# Patient Record
Sex: Male | Born: 1990 | Race: White | Hispanic: No | Marital: Single | Smoking: Current every day smoker
Health system: Southern US, Community
[De-identification: ages and names within clinical notes are randomized; demographics above are authoritative.]

## PROBLEM LIST (undated history)

## (undated) DIAGNOSIS — R569 Unspecified convulsions: Secondary | ICD-10-CM

## (undated) HISTORY — PX: APPENDECTOMY: SHX54

---
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
## 2010-12-02 LAB — POCT URINALYSIS DIP (DEVICE)
Glucose, UA: NEGATIVE mg/dL
Specific Gravity, Urine: 1.02 (ref 1.005–1.030)
Urobilinogen, UA: 0.2 mg/dL (ref 0.0–1.0)

## 2011-06-14 ENCOUNTER — Observation Stay (HOSPITAL_COMMUNITY)
Admission: EM | Admit: 2011-06-14 | Discharge: 2011-06-15 | DRG: 883 | Disposition: A | Discharge status: Home / Self Care | Payer: BC Managed Care – HMO | Attending: General Surgery | Admitting: General Surgery

## 2011-06-14 ENCOUNTER — Emergency Department (HOSPITAL_COMMUNITY): Payer: BC Managed Care – HMO

## 2011-06-14 DIAGNOSIS — R1031 Right lower quadrant pain: Secondary | ICD-10-CM

## 2011-06-14 DIAGNOSIS — R112 Nausea with vomiting, unspecified: Secondary | ICD-10-CM

## 2011-06-14 DIAGNOSIS — K3580 Unspecified acute appendicitis: Principal | ICD-10-CM | POA: Insufficient documentation

## 2011-06-14 LAB — DIFFERENTIAL
Basophils Absolute: 0 10*3/uL (ref 0.0–0.1)
Basophils Relative: 0 % (ref 0–1)
Monocytes Relative: 4 % (ref 3–12)
Neutro Abs: 15.9 10*3/uL — ABNORMAL HIGH (ref 1.7–7.7)
Neutrophils Relative %: 87 % — ABNORMAL HIGH (ref 43–77)

## 2011-06-14 LAB — COMPREHENSIVE METABOLIC PANEL
ALT: 9 U/L (ref 0–53)
AST: 15 U/L (ref 0–37)
Alkaline Phosphatase: 111 U/L (ref 39–117)
CO2: 27 mEq/L (ref 19–32)
Chloride: 100 mEq/L (ref 96–112)
GFR calc Af Amer: >90 mL/min (ref >90)
GFR calc non Af Amer: >90 mL/min (ref >90)
Glucose, Bld: 101 mg/dL — ABNORMAL HIGH (ref 70–99)
Potassium: 3.8 mEq/L (ref 3.5–5.1)
Sodium: 138 mEq/L (ref 135–145)

## 2011-06-14 LAB — CBC
Hemoglobin: 17 g/dL (ref 13.0–17.0)
MCH: 32.7 pg (ref 26.0–34.0)
Platelets: 274 10*3/uL (ref 150–400)
RBC: 5.2 MIL/uL (ref 4.22–5.81)
WBC: 18.3 10*3/uL — ABNORMAL HIGH (ref 4.0–10.5)

## 2011-06-14 MED ORDER — IOHEXOL 300 MG/ML  SOLN
75.0000 mL | Freq: Once | INTRAMUSCULAR | Status: AC | PRN
  Administered 2011-06-14: 75 mL via INTRAVENOUS

## 2011-06-15 ENCOUNTER — Other Ambulatory Visit (INDEPENDENT_AMBULATORY_CARE_PROVIDER_SITE_OTHER): Payer: Self-pay | Admitting: Surgery

## 2011-06-15 DIAGNOSIS — K3580 Unspecified acute appendicitis: Secondary | ICD-10-CM

## 2011-06-19 NOTE — Discharge Summary (Signed)
  NAME:  [name], [name] NO.:  [id number]  MEDICAL RECORD NO.:  [id number]  LOCATION:  1529                         FACILITY:  [facility]  PHYSICIAN:  [name], MDDATE OF BIRTH:  1990/09/09  DATE OF ADMISSION:  06/14/2011 DATE OF DISCHARGE:  06/15/2011                              DISCHARGE SUMMARY   ADMISSION DIAGNOSIS:  Acute appendicitis.  DISCHARGE DIAGNOSIS:  Acute appendicitis.  PROCEDURE:  Laparoscopic appendectomy on June 15, 2011.  BRIEF HISTORY:  The patient is a healthy 20 year old white male, who identified no primary medical doctor.  He developed abdominal pain around 4 a.m. on the day of admission, which got progressively worse. He was seen by Urgent Care and had been to that Urgent Care several times, but not for abdominal pain.  They sent him to the ER at [facility], where a CT scan was obtained and was suggestive of acute appendicitis.  He was seen by Dr. Ovidio Kin and was subsequently admitted for appendectomy.  He does have some history of irritable bowel syndrome.  He has no prior abdominal surgery.  For further history and physical, please see the dictated note.  HOSPITAL COURSE:  Patient was admitted and taken to the operating room in the early a.m. of June 15, 2011.  He tolerated the procedure well. Following a.m., he was having a fair amount of pain and gave him some Toradol.  He is also having some constipation.  He has been treated with MiraLax.  Initially, I did not feel like he would be ready for discharge, but at 4 o'clock, he was feeling much better and is ready for discharge.  He was able to eat lunch and breakfast without any problems. His incisions look good.  He has postoperative laparoscopic surgery, instructions for follow-up in our office, and instructions to call for fever, difficulty of voiding, bleeding from his incision, increased pain, redness, or drainage.  DISCHARGE MEDICATIONS:  He can use either  Tylenol or ibuprofen p.r.n. for mild pain and Vicodin 5/325 one to two p.o. q.4 h. p.r.n. for pain, if not relieved by the first two.  He is to call our office for followup with Dr. Ezzard Standing in 2 to 3 weeks.  CONDITION ON DISCHARGE:  Improved.    [name], P.A.   ______________________________ Casey Gosling, MD   WDJ/MEDQ  D:  06/15/2011  T:  06/15/2011  Job:  [id number]  Electronically Signed by Sherrie George P.A. on 06/17/2011 08:56:31 PM Electronically Signed by Emelia Loron MD on 06/19/2011 11:35:38 AM

## 2011-06-19 NOTE — Consult Note (Signed)
NAMECYRIL, Abbott           ACCOUNT NO.:  [id number]  MEDICAL RECORD NO.:  [id number]  LOCATION:  1529                         FACILITY:  [facility]  PHYSICIAN:  Sandria Bales. Ezzard Standing, M.D.  DATE OF BIRTH:  1991/08/02  DATE OF CONSULTATION: 14 Jun 2011                                CONSULTATION   REFERRING PHYSICIAN:  Lorre Nick, M.D.  REASON FOR CONSULTATION:  Appendicitis.  HISTORY OF PRESENT ILLNESS:  This is a 20 year old white male, who has no identified primary medical doctor, woke up about 4 a.m. this morning with some abdominal pain, which got worse.  By noon, he got bad enough that he went to the Urgent Care on [address].  Apparently, he has been to that Urgent Care several times by the way he describes, but not for this type of abdominal pain.  They, then sent him to the [facility] Emergency Room, where a CT scan was done.  The CT scan read by Dr. Kennith Center suggested a suspicious for early appendicitis.  The patient has had no prior history of peptic ulcer disease, liver disease, pancreatic disease, or colon disease, but he does have some irritable bowel.  He has had no prior abdominal surgery.  He has no allergies.  He is on no medications.  REVIEW OF SYSTEMS:  NEUROLOGIC:  No seizures or loss of consciousness. CARDIAC:  No history of heart disease or chest pain.   PULMONARY:  No history of pneumonia, though he does smoke a hookah.   GASTROINTESTINAL: See history of present illness.  UROLOGIC:  No history of kidney stones or kidney infections.   MUSCULOSKELETAL:  No joint or back trouble.  PERSONAL HISTORY:  He is accompanied by his mother, [name].  She had her appendix removed when she had her gallbladder taken out, so there is no hsitory of appendicitis in the family.  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  He otherwise is  staying in his mother's house.  PHYSICAL EXAMINATION:  VITAL SIGNS:  His temperature is 98, blood pressure 130/82, pulse 71, respirations 20. GENERAL:  He is a well-nourished white male, who is somewhat thin.  He is also little bit rambling in his conversation. HEENT:  Unremarkable. NECK:  Supple without mass, without thyromegaly. LUNGS:  Clear to auscultation with symmetric breath sounds. HEART:  Has a regular rhythm.  I hear no murmur or rub.   ABDOMEN:  Shows bowel sounds, which are present, but decreased.  He is tender in his right lower quadrant without really guarding.  His abdomen is scaphoid. He has no abdominal scars. EXTREMITIES:  He has good strength in upper and lower extremities. NEUROLOGIC:  Grossly intact.  LABORATORY DATA:  Labs I have show a hemoglobin of 17, hematocrit 47, white blood count of 18,300.  His platelet count 274,000.  His sodium is 138, potassium 3.8, chloride of 100, CO2 of 27, glucose of 101, BUN of 13, creatinine of 0.87.  He has been told he has an elevated bilirubin, actually bilirubin today is normal.  Review of CT scan, which shows what appears to be an appendolith and a somewhat enlarged appendix without stranding.  ASSESSMENT AND PLAN/IMPRESSION:  Probably early appendicitis.  I discussed with the patient and his mother about proceeding with an appendectomy tonight.  I told them we try to do this laparoscopically. The risks of surgery include bleeding, infection, some other diagnosis than appendicitis.    His mother is, [name], her phone number is [phone].   Sandria Bales. Ezzard Standing, M.D., FACS   DHN/MEDQ  D:  06/14/2011  T:  06/15/2011  Job:  [id number]  Electronically Signed by Ovidio Kin M.D. on 06/19/2011 11:48:57 AM

## 2011-06-19 NOTE — Op Note (Signed)
NAMEKRIS, [name]           ACCOUNT [name].:  [id number]  MEDICAL RECORD [name].:  [id number]  LOCATION:  1529                         FACILITY:  [facility]  PHYSICIAN:  Sandria Bales. Ezzard Standing, M.D.  DATE OF BIRTH:  07-09-1991  DATE OF PROCEDURE:  06/15/2011                              OPERATIVE REPORT   PREOPERATIVE DIAGNOSIS:  Acute appendicitis.  POSTOPERATIVE DIAGNOSIS:  Acute suppurative appendicitis with infarcted tip.  PROCEDURES:  Laparoscopic appendectomy.  SURGEON:  Sandria Bales. Ezzard Standing, M.D.  FIRST ASSISTANT:  None.  ANESTHESIA:  General endotracheal, supervised by Dr. Lucille Passy, with 15 cc of 0.25% Marcaine.  COMPLICATIONS:  None.  INDICATION FOR PROCEDURE:  Mr. [name] is a 20 year old white male, who has [name] primary care doctor, but was seen by Maryjean Morn, PA at [facility] on [address] for abdominal pain.  He was sent to the [facility] Emergency room where CT scan revealed probable appendicitis.  I discussed the indications, potential complications of the surgery with the patient and his mother.  The potential complications include, but are not limited to, bleeding, bowel injury, open surgery, or another diagnosis than appendicitis.  OPERATIVE NOTE:  Patient was given a general endotracheal anesthesia, supervised Dr. Lucille Passy, in room #1.  His right arm was out, his left arm was tucked to his side.  I did not put a Foley in.  He was on cefoxitin antibiotic and re-dosed immediately preop.  A time-out was held and surgical checklist run.  I went through an infraumbilical incision with sharp dissection carried down to the abdominal cavity.  A 0 degree 10 mm laparoscope was inserted through a 12 mm Hasson trocar.  The Hasson trocar was secured with a 0 Vicryl suture.  I used both 5 and a 10 mm scope.  I placed a 5 mm trocar in the right upper quadrant, a 5 mm trocar in left lower quadrant.  The right and left lobes of liver unremarkable.   Stomach was unremarkable.  The bowel that I could see was unremarkable.  On the right lower quadrant, I found the appendix.  The tip of the appendix was maybe 2 cm, looks like infarcted, it was purulent and was encased in omentum.  I teased the omentum away through the mesentery of the appendix down with a Harmonic Scalpel.  I used a 45 mm vascular load of the Ethicon Endo-GIA stapler to divide the appendix.  Appendix was placed in EndoCatch bag and delivered through the umbilicus and sent to pathology.  I irrigated the abdomen with about 500 cc of saline.  I reinspected the appendiceal stump and the cecum which looked good, but he had some adhesions in his right colon along the right lateral abdominal wall, but I left these alone. 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  I then removed the Hasson trocar and closed with a 0 Vicryl suture.  I pulled up the 5 mm trocar and closed the skin on each site with a 5-0 Monocryl suture, painted the wounds with Dermabond.    The patient tolerated procedure well, was transferred to recovery in good condition.  Sponge and needle counts were correct in the case.   Sandria Bales.  Ezzard Standing, M.D., FACS    DHN/MEDQ  D:  06/15/2011  T:  06/15/2011  Job:  [id number]  cc:   Maryjean Morn, PA [facility] [place], [place]  Electronically Signed by Ovidio Kin M.D. on 06/19/2011 11:50:00 AM

## 2013-04-19 ENCOUNTER — Emergency Department (HOSPITAL_COMMUNITY)
Admission: EM | Admit: 2013-04-19 | Discharge: 2013-04-19 | Disposition: A | Discharge status: Home / Self Care | Payer: No Typology Code available for payment source | Attending: Emergency Medicine | Admitting: Emergency Medicine

## 2013-04-19 ENCOUNTER — Emergency Department (HOSPITAL_COMMUNITY): Payer: No Typology Code available for payment source

## 2013-04-19 ENCOUNTER — Encounter (HOSPITAL_COMMUNITY): Payer: Self-pay | Admitting: *Deleted

## 2013-04-19 DIAGNOSIS — S139XXA Sprain of joints and ligaments of unspecified parts of neck, initial encounter: Secondary | ICD-10-CM | POA: Insufficient documentation

## 2013-04-19 DIAGNOSIS — Z8669 Personal history of other diseases of the nervous system and sense organs: Secondary | ICD-10-CM | POA: Insufficient documentation

## 2013-04-19 DIAGNOSIS — S298XXA Other specified injuries of thorax, initial encounter: Secondary | ICD-10-CM | POA: Insufficient documentation

## 2013-04-19 DIAGNOSIS — Y92410 Unspecified street and highway as the place of occurrence of the external cause: Secondary | ICD-10-CM | POA: Insufficient documentation

## 2013-04-19 DIAGNOSIS — S161XXA Strain of muscle, fascia and tendon at neck level, initial encounter: Secondary | ICD-10-CM

## 2013-04-19 DIAGNOSIS — R252 Cramp and spasm: Secondary | ICD-10-CM

## 2013-04-19 DIAGNOSIS — F17200 Nicotine dependence, unspecified, uncomplicated: Secondary | ICD-10-CM | POA: Insufficient documentation

## 2013-04-19 DIAGNOSIS — Y9389 Activity, other specified: Secondary | ICD-10-CM | POA: Insufficient documentation

## 2013-04-19 DIAGNOSIS — R209 Unspecified disturbances of skin sensation: Secondary | ICD-10-CM | POA: Insufficient documentation

## 2013-04-19 HISTORY — DX: Unspecified convulsions: R56.9

## 2013-04-19 MED ORDER — IBUPROFEN 800 MG PO TABS: 800.0000 mg | ORAL_TABLET | Freq: Three times a day (TID) | ORAL | Status: AC

## 2013-04-19 NOTE — ED Provider Notes (Signed)
Medical screening examination/treatment/procedure(s) were performed by non-physician practitioner and as supervising physician I was immediately available for consultation/collaboration.   Junius Argyle, MD 04/19/13 1556
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
## 2013-04-19 NOTE — Progress Notes (Signed)
[facility] did not get to see patient but will be sending him information about the [facility], using the address provided.  °
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
## 2013-05-11 IMAGING — CT CT ABD-PELV W/ CM
2 of 4 series · 16 of 46 positions shown, 18 images · IV contrast (APPLIED)
Comparison: None.

CLINICAL DATA: Right lower quadrant pain.  Nausea.

CT ABDOMEN AND PELVIS WITH CONTRAST
TECHNIQUE: Multidetector CT imaging of the abdomen and pelvis was
performed following the standard protocol during bolus
administration of intravenous contrast.
Contrast: 75mL OMNIPAQUE IOHEXOL 300 MG/ML IV SOLN

[Series 2: abd/pel with · axial · 0.74mm/px · z∈[+763,+1178]mm · 13 of 91 slices shown, 15 images]
[im 4/91  soft-tissue]
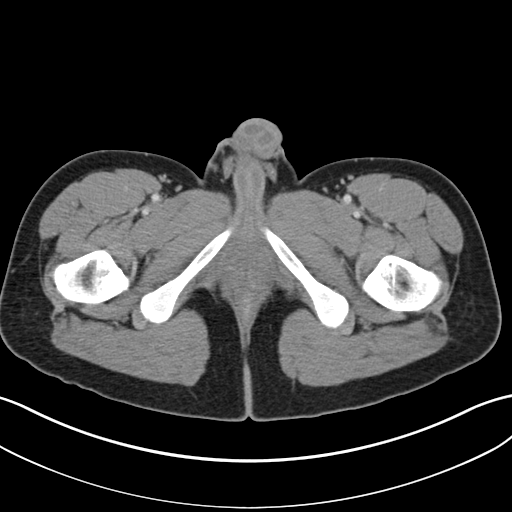
[im 4/91  bone]
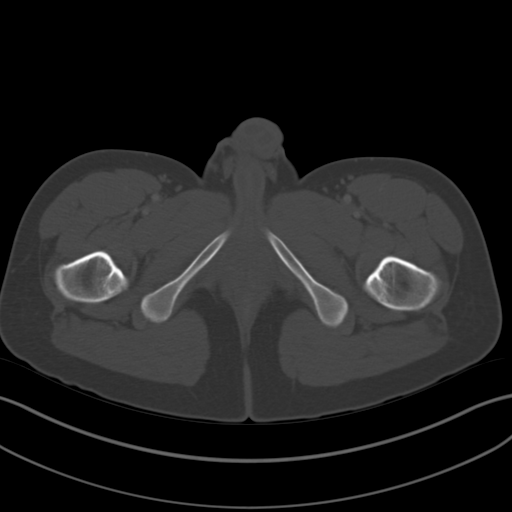
[im 11/91  soft-tissue]
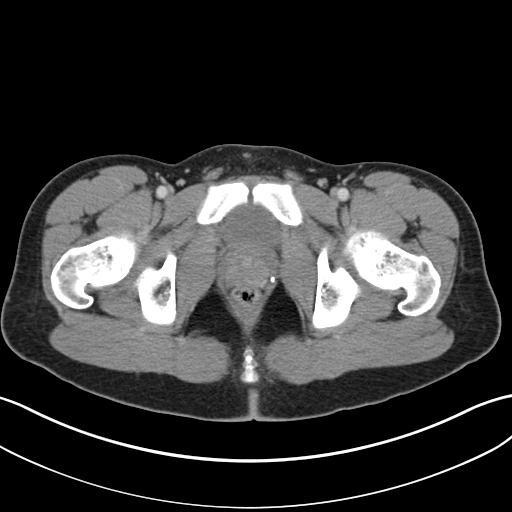
[im 19/91  soft-tissue]
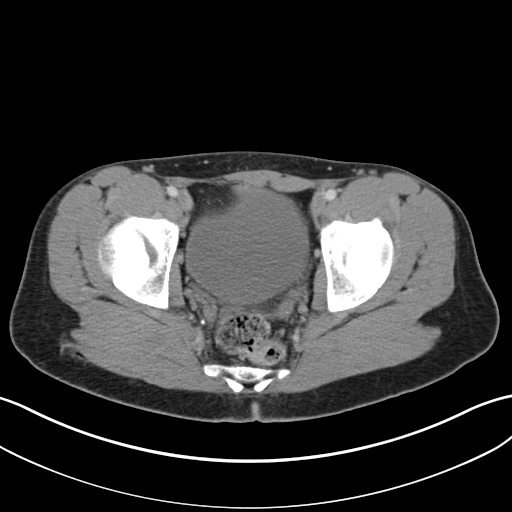
[im 26/91  soft-tissue]
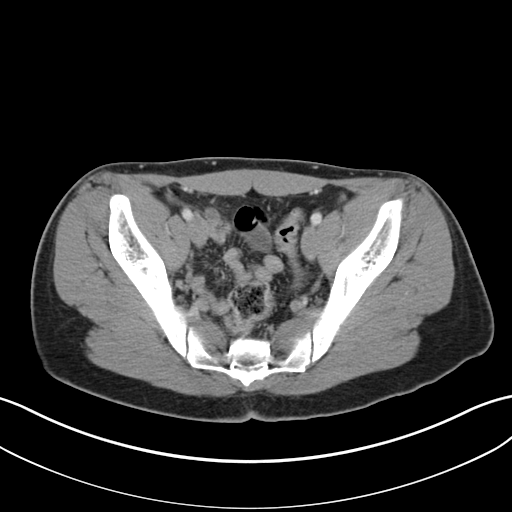
[im 33/91  soft-tissue]
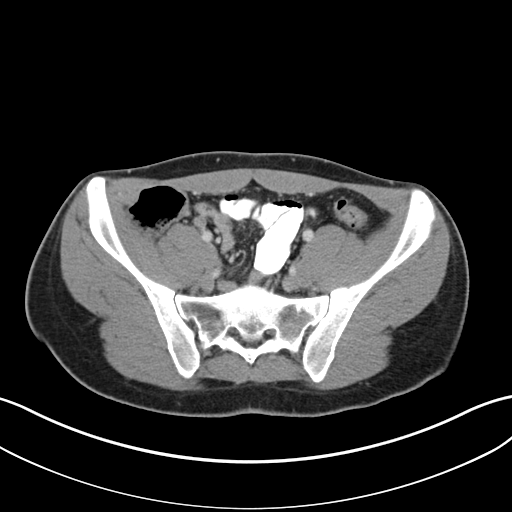
[im 40/91  soft-tissue]
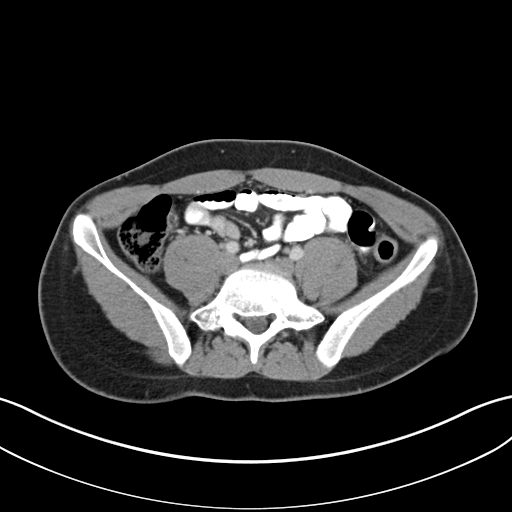
[im 47/91  soft-tissue]
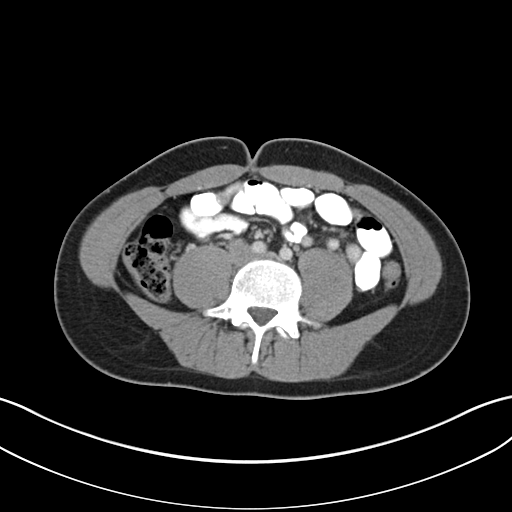
[im 51/91  soft-tissue]
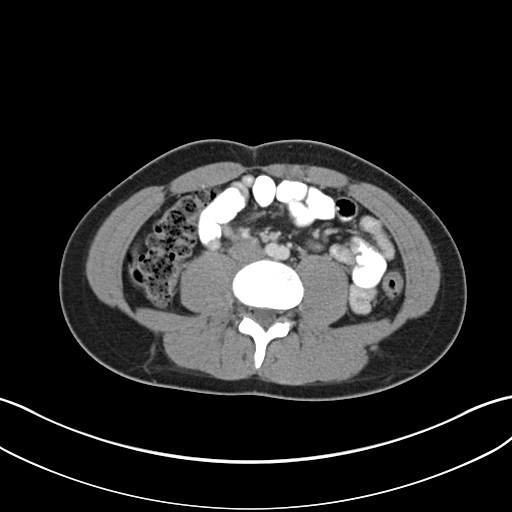
[im 58/91  soft-tissue]
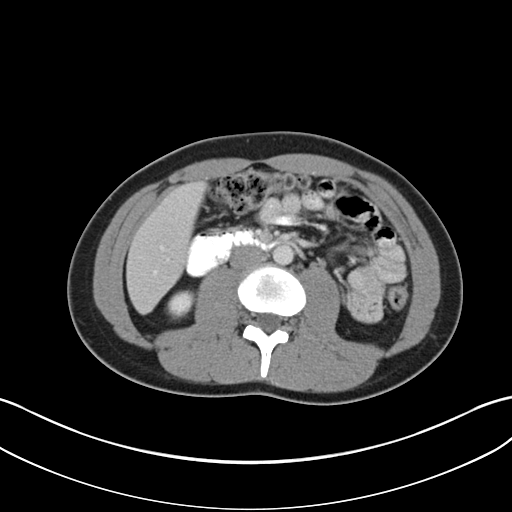
[im 58/91  bone]
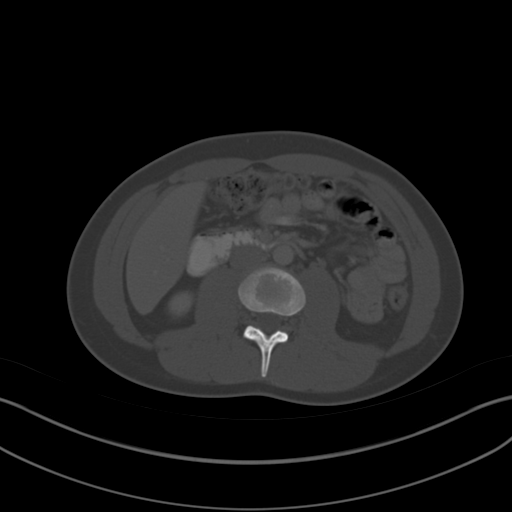
[im 65/91  soft-tissue]
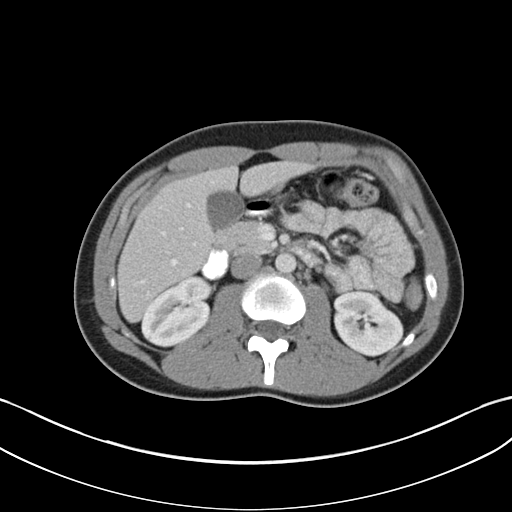
[im 73/91  soft-tissue]
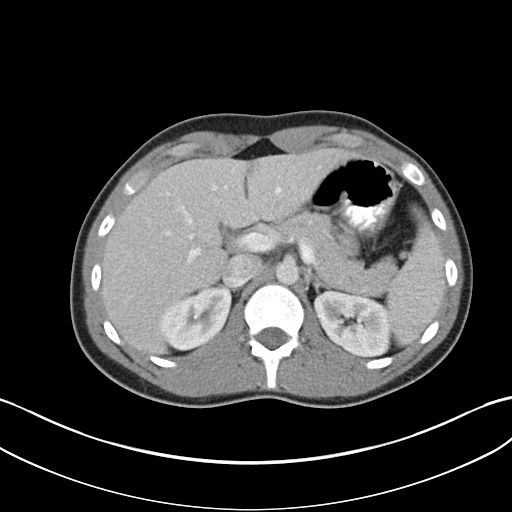
[im 80/91  soft-tissue]
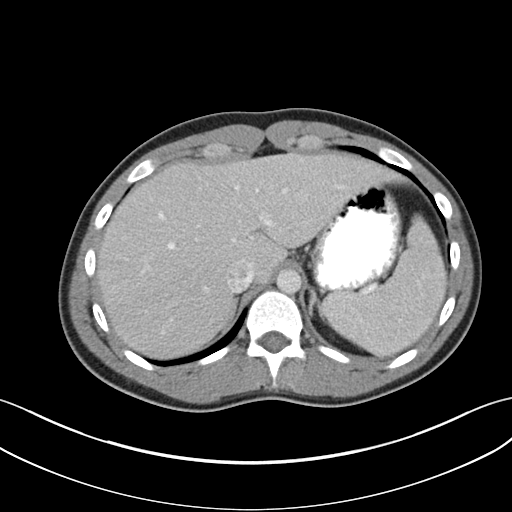
[im 87/91  soft-tissue]
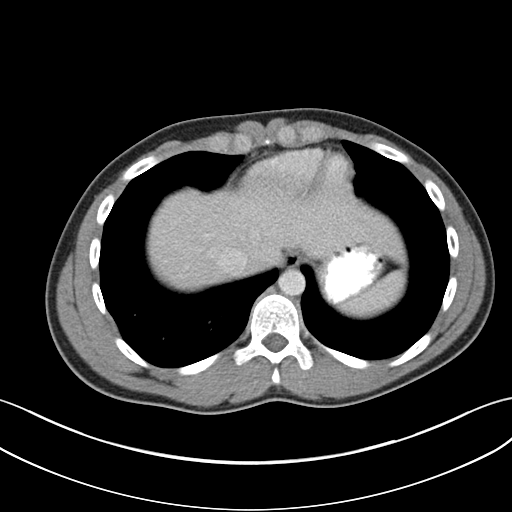

[Series 5: coronal · coronal · 0.74mm/px · 3 of 37 slices shown]
[im 13/37  soft-tissue]
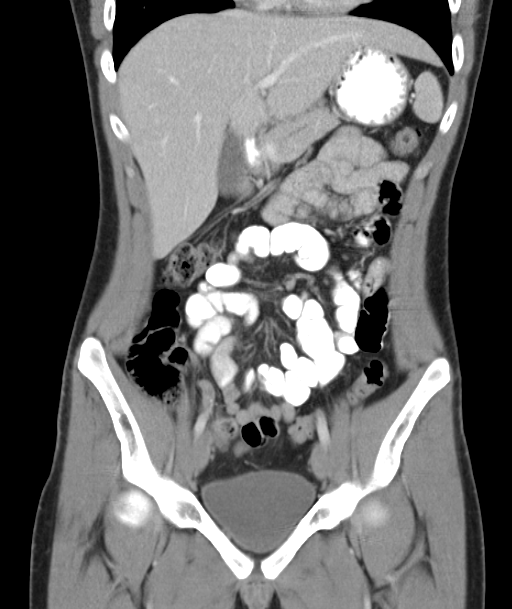
[im 17/37  soft-tissue]
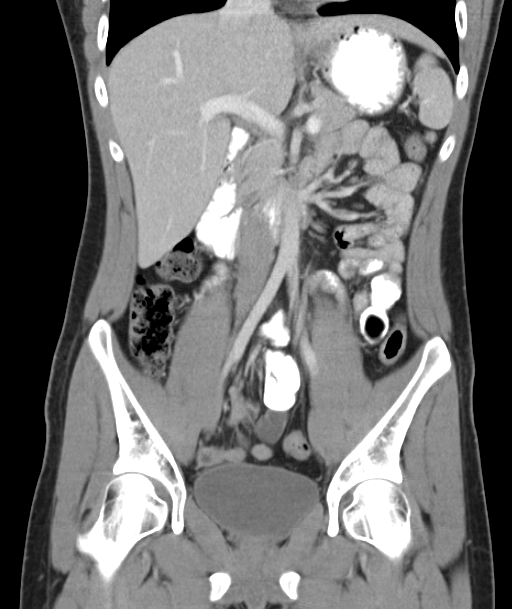
[im 21/37  soft-tissue]
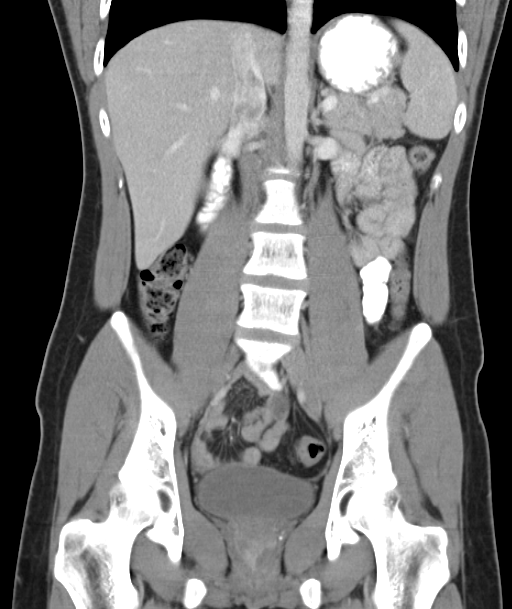

[16 of 46 positions shown; findings below may reference images not displayed]

FINDINGS: No focal abnormalities seen in the liver or spleen.  The
stomach, duodenum, pancreas, gallbladder, adrenal glands, and
kidneys have normal imaging features.

No abdominal aortic aneurysm.  No intraperitoneal free fluid or
abdominal lymphadenopathy.  The abdominal bowel loops are
unremarkable.

Imaging through the pelvis shows no free intraperitoneal fluid.  No
pelvic sidewall lymphadenopathy.  Bladder is normal.  Prostate
gland is unremarkable.

Diffuse scattered diverticuli are visible in the sigmoid colon
without diverticulitis.  The terminal ileum is normal.  The tip of
the appendix is dilated measuring up to 11 mm in diameter.  Lumen
of the appendiceal tip is fluid-filled.  3 mm appendicolith is
identified in the mid appendix.  There is no substantial edema or
inflammation around the appendix at this time.

Bone windows reveal no worrisome lytic or sclerotic osseous
lesions.
IMPRESSION: Although there is no substantial edema or inflammation around the
appendix at this time, it is dilated fluid-filled up to 11 mm in
diameter.  A 3 mm appendicolith is seen in the mid appendix.  As
such, imaging features are considered very suspicious for acute
appendicitis.

Findings were called by me directly to Dr. Tisha in the emergency
department at 6888 hours on 06/14/2011.

## 2015-03-17 IMAGING — CR DG CHEST 2V
2 series · 2 of 2 positions shown · non-contrast
Comparison: Chest x-ray 04/19/2013.

CLINICAL DATA: Shortness of breath.

CHEST - 2 VIEW

[w chest pa]
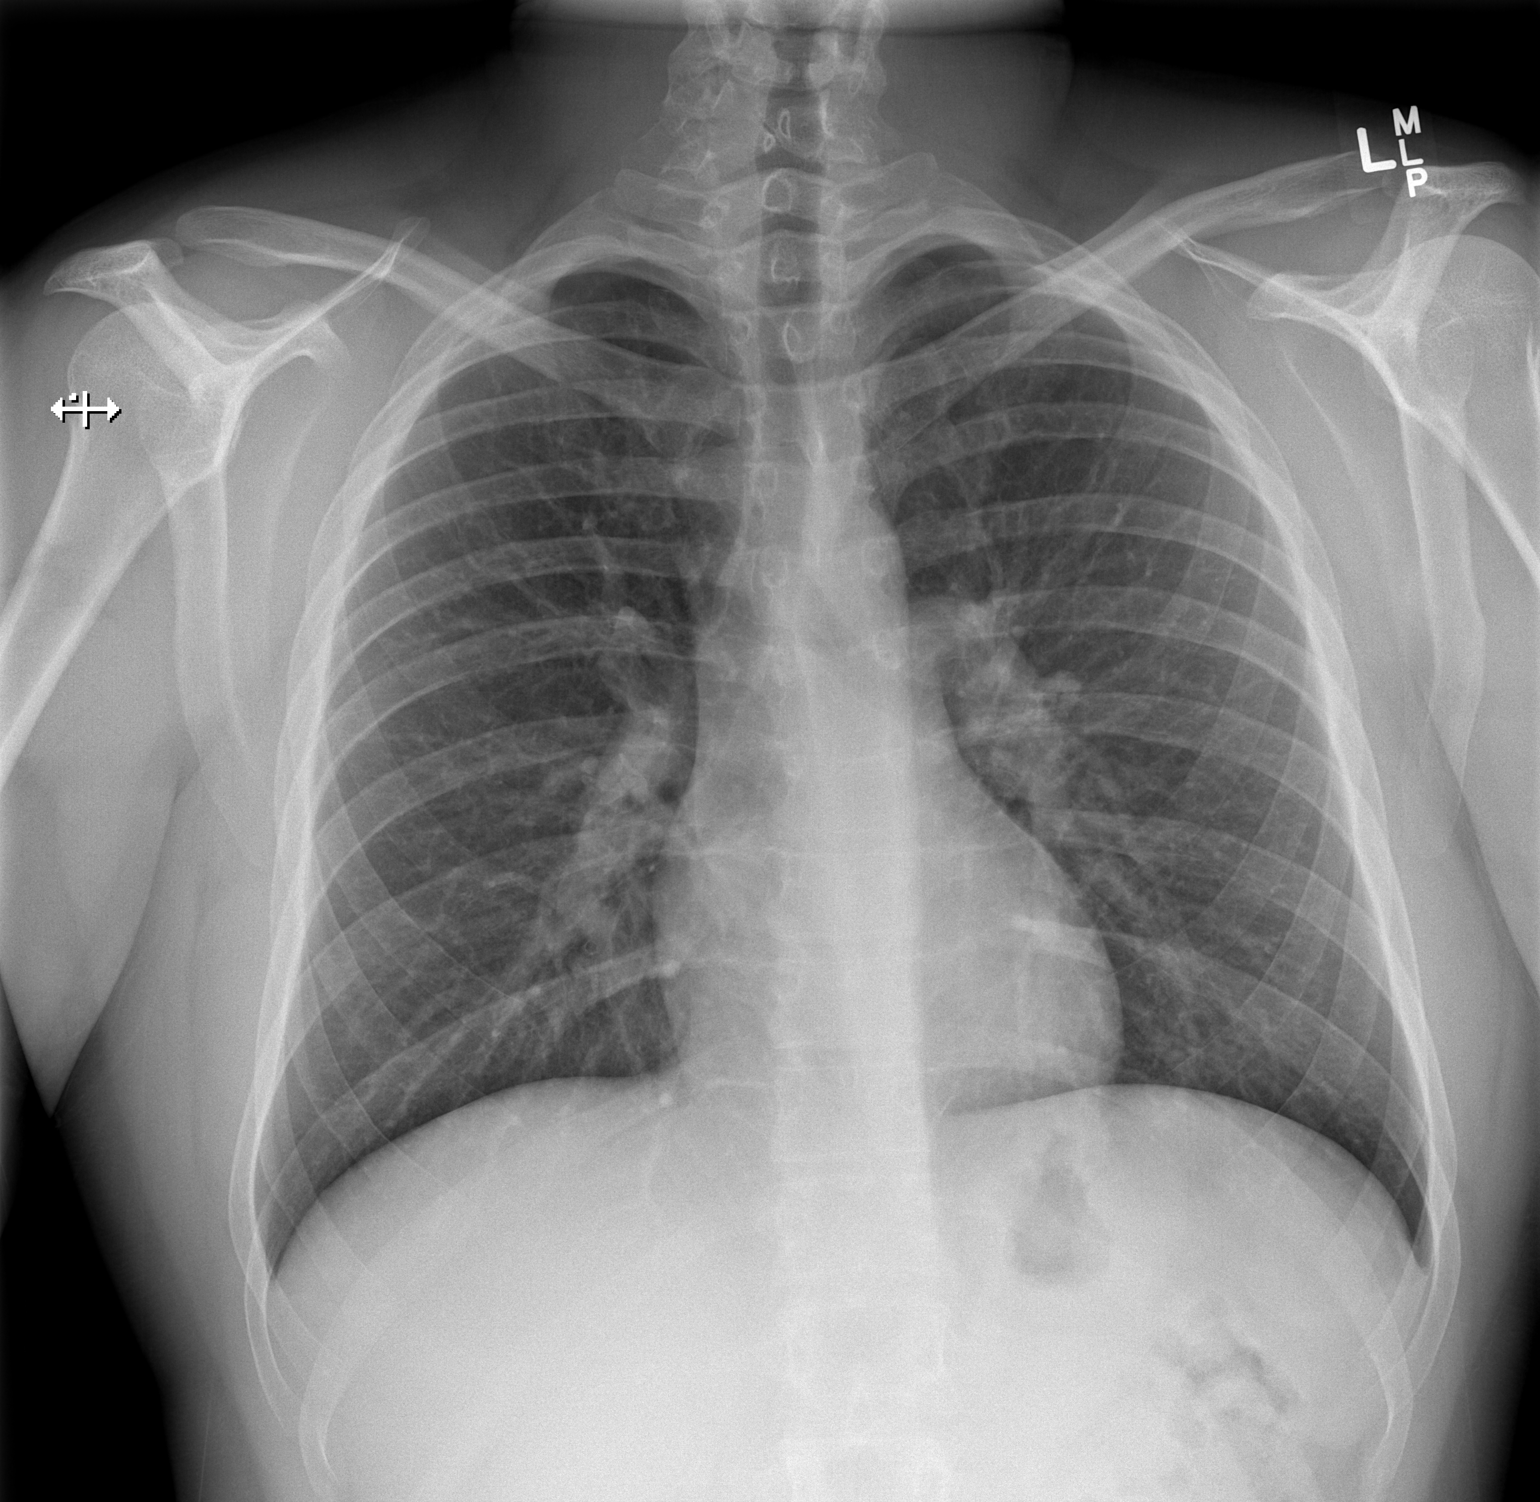

[w chest lat]
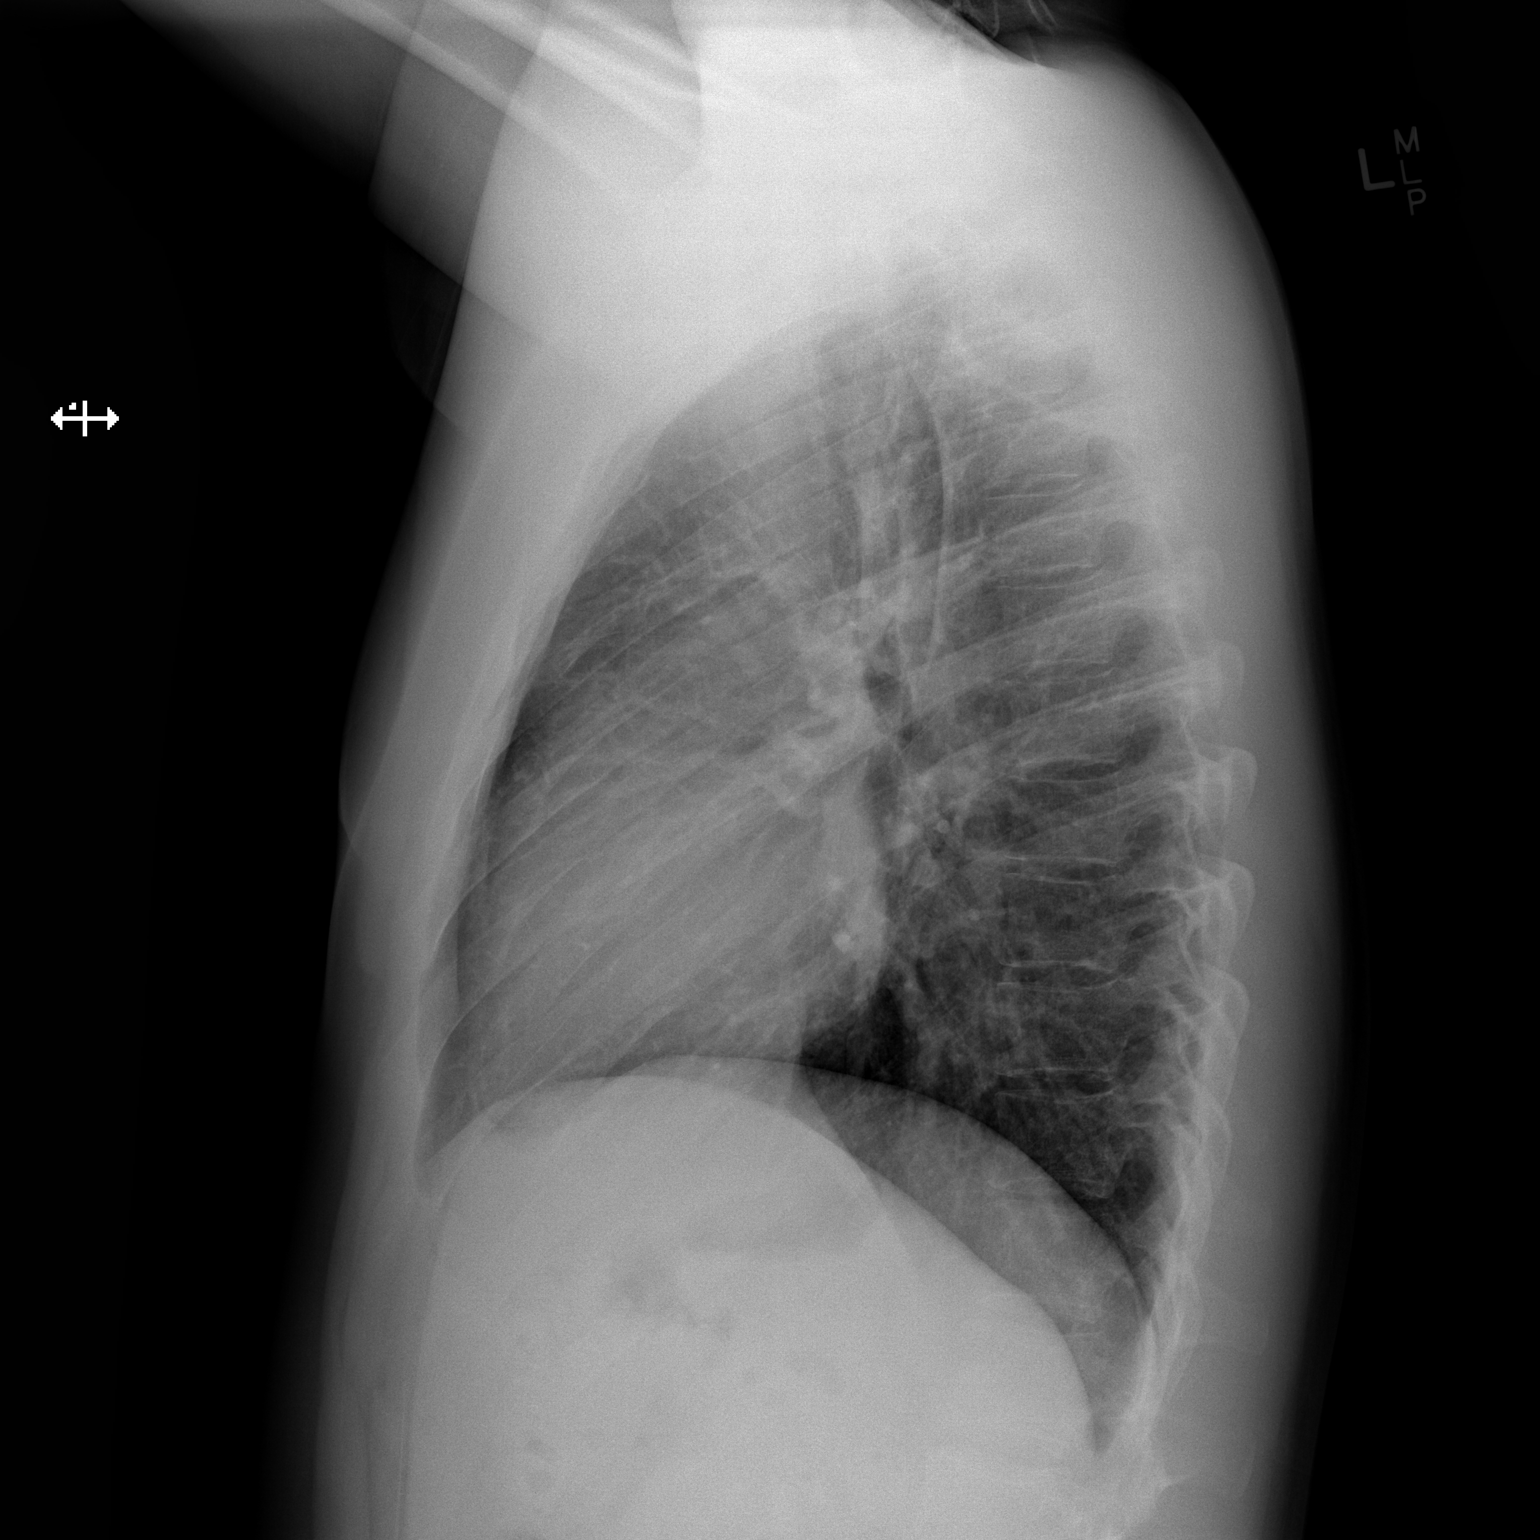

[2 of 2 positions shown; findings below may reference images not displayed]

FINDINGS: Lung volumes are normal.  No consolidative airspace
disease.  No pleural effusions.  No pneumothorax.  No pulmonary
nodule or mass noted.  Pulmonary vasculature and the
cardiomediastinal silhouette are within normal limits.
IMPRESSION: 1. No radiographic evidence of acute cardiopulmonary disease.

## 2018-07-02 ENCOUNTER — Emergency Department (HOSPITAL_COMMUNITY)
Admission: EM | Admit: 2018-07-02 | Discharge: 2018-07-02 | Disposition: A | Discharge status: Home / Self Care | Payer: Worker's Compensation | Attending: Emergency Medicine | Admitting: Emergency Medicine

## 2018-07-02 ENCOUNTER — Encounter (HOSPITAL_COMMUNITY): Payer: Self-pay

## 2018-07-02 DIAGNOSIS — S0990XA Unspecified injury of head, initial encounter: Secondary | ICD-10-CM

## 2018-07-02 DIAGNOSIS — Y990 Civilian activity done for income or pay: Secondary | ICD-10-CM | POA: Insufficient documentation

## 2018-07-02 DIAGNOSIS — Y929 Unspecified place or not applicable: Secondary | ICD-10-CM | POA: Insufficient documentation

## 2018-07-02 DIAGNOSIS — Y93H3 Activity, building and construction: Secondary | ICD-10-CM | POA: Diagnosis not present

## 2018-07-02 DIAGNOSIS — F17200 Nicotine dependence, unspecified, uncomplicated: Secondary | ICD-10-CM | POA: Insufficient documentation

## 2018-07-02 DIAGNOSIS — W228XXA Striking against or struck by other objects, initial encounter: Secondary | ICD-10-CM | POA: Diagnosis not present
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
## 2018-08-28 DIAGNOSIS — R300 Dysuria: Secondary | ICD-10-CM | POA: Diagnosis not present

## 2019-07-18 ENCOUNTER — Other Ambulatory Visit: Payer: Self-pay

## 2019-07-18 DIAGNOSIS — Z20822 Contact with and (suspected) exposure to covid-19: Secondary | ICD-10-CM

## 2019-07-19 LAB — NOVEL CORONAVIRUS, NAA: SARS-CoV-2, NAA: NOT DETECTED

## 2019-07-20 ENCOUNTER — Telehealth: Payer: Self-pay | Admitting: General Practice

## 2019-07-20 NOTE — Telephone Encounter (Signed)
Patient is calling back for his Negative COVID test results. Patient expressed understanding.
